# Patient Record
Sex: Male | Born: 1975 | Race: White | Hispanic: No | Marital: Single | Smoking: Current every day smoker
Health system: Southern US, Community
[De-identification: ages and names within clinical notes are randomized; demographics above are authoritative.]

## PROBLEM LIST (undated history)

## (undated) DIAGNOSIS — F32A Depression, unspecified: Secondary | ICD-10-CM

## (undated) DIAGNOSIS — F988 Other specified behavioral and emotional disorders with onset usually occurring in childhood and adolescence: Secondary | ICD-10-CM

## (undated) DIAGNOSIS — F419 Anxiety disorder, unspecified: Secondary | ICD-10-CM

## (undated) DIAGNOSIS — F329 Major depressive disorder, single episode, unspecified: Secondary | ICD-10-CM

## (undated) HISTORY — DX: Depression, unspecified: F32.A

## (undated) HISTORY — DX: Major depressive disorder, single episode, unspecified: F32.9

## (undated) HISTORY — DX: Anxiety disorder, unspecified: F41.9

## (undated) HISTORY — DX: Other specified behavioral and emotional disorders with onset usually occurring in childhood and adolescence: F98.8

---

## 2010-09-18 ENCOUNTER — Emergency Department (HOSPITAL_COMMUNITY)
Admission: EM | Admit: 2010-09-18 | Discharge: 2010-09-18 | Disposition: A | Discharge status: Home / Self Care | Payer: Self-pay | Attending: Emergency Medicine | Admitting: Emergency Medicine

## 2010-09-18 ENCOUNTER — Emergency Department (HOSPITAL_COMMUNITY): Payer: Self-pay

## 2010-09-18 DIAGNOSIS — IMO0002 Reserved for concepts with insufficient information to code with codable children: Secondary | ICD-10-CM | POA: Insufficient documentation

## 2010-09-18 DIAGNOSIS — M25519 Pain in unspecified shoulder: Secondary | ICD-10-CM | POA: Insufficient documentation

## 2010-09-20 ENCOUNTER — Emergency Department (HOSPITAL_COMMUNITY): Payer: Self-pay

## 2010-09-20 ENCOUNTER — Emergency Department (HOSPITAL_COMMUNITY)
Admission: EM | Admit: 2010-09-20 | Discharge: 2010-09-20 | Disposition: A | Discharge status: Home / Self Care | Payer: Self-pay | Attending: Emergency Medicine | Admitting: Emergency Medicine

## 2010-09-20 DIAGNOSIS — Y9383 Activity, rough housing and horseplay: Secondary | ICD-10-CM | POA: Insufficient documentation

## 2010-09-20 DIAGNOSIS — X58XXXA Exposure to other specified factors, initial encounter: Secondary | ICD-10-CM | POA: Insufficient documentation

## 2010-09-20 DIAGNOSIS — M25519 Pain in unspecified shoulder: Secondary | ICD-10-CM | POA: Insufficient documentation

## 2010-09-25 ENCOUNTER — Emergency Department (HOSPITAL_BASED_OUTPATIENT_CLINIC_OR_DEPARTMENT_OTHER): Payer: Self-pay

## 2010-09-25 ENCOUNTER — Emergency Department (HOSPITAL_BASED_OUTPATIENT_CLINIC_OR_DEPARTMENT_OTHER)
Admission: EM | Admit: 2010-09-25 | Discharge: 2010-09-25 | Disposition: A | Discharge status: Home / Self Care | Payer: Self-pay | Attending: Emergency Medicine | Admitting: Emergency Medicine

## 2010-09-25 DIAGNOSIS — F1910 Other psychoactive substance abuse, uncomplicated: Secondary | ICD-10-CM | POA: Insufficient documentation

## 2010-09-25 DIAGNOSIS — Z765 Malingerer [conscious simulation]: Secondary | ICD-10-CM | POA: Insufficient documentation

## 2010-09-25 MED ORDER — KETOROLAC TROMETHAMINE 30 MG/ML IJ SOLN
30.0000 mg | Freq: Once | INTRAMUSCULAR | Status: DC
  Filled 2010-09-25: qty 1

## 2010-09-25 NOTE — ED Provider Notes (Signed)
History     CSN: [id number] Arrival date & time: 09/25/2010 10:44 AM  No chief complaint on file.  HPI  35 year old male who comes in today stating that he had his right shoulder injured wrestling with his brother earlier this morning. He states his brother landed on his right shoulder with his elbow. He is complaining of pain in his right shoulder and states he felt something pop. He states he has not had any injury in the shoulder before. He denies any previous dislocation. He denies any numbness or tingling.  No past medical history on file. Reviewed No past surgical history on file.  No family history on file.  History  Substance Use Topics  . Smoking status: Not on file  . Smokeless tobacco: Not on file  . Alcohol Use: Not on file      Review of Systems  All other systems reviewed and are negative.    Physical Exam  There were no vitals taken for this visit.  Physical Exam  ED Course  Procedures  MDM Patient's previous charts reviewed and patient noted to have 2 previous visits to other emergency departments in her system this week with 2 right shoulder x-rays obtained. Patient again questioned about prior injury to her shoulder and denied that he has had a previous injury. Discussed with patient that this would be his third shoulder x-[employer]der this week. Patient then left AGAINST MEDICAL ADVICE.      Hilario Quarry, MD 09/28/10 1501

## 2010-09-27 ENCOUNTER — Emergency Department (HOSPITAL_COMMUNITY)
Admission: EM | Admit: 2010-09-27 | Discharge: 2010-09-27 | Disposition: A | Discharge status: Home / Self Care | Payer: Self-pay | Attending: Emergency Medicine | Admitting: Emergency Medicine

## 2010-09-27 ENCOUNTER — Emergency Department (HOSPITAL_COMMUNITY): Payer: Self-pay

## 2010-09-27 DIAGNOSIS — R569 Unspecified convulsions: Secondary | ICD-10-CM | POA: Insufficient documentation

## 2010-09-27 DIAGNOSIS — R1110 Vomiting, unspecified: Secondary | ICD-10-CM | POA: Insufficient documentation

## 2010-09-27 DIAGNOSIS — J329 Chronic sinusitis, unspecified: Secondary | ICD-10-CM | POA: Insufficient documentation

## 2010-09-27 LAB — URINALYSIS, ROUTINE W REFLEX MICROSCOPIC
Bilirubin Urine: NEGATIVE
Glucose, UA: NEGATIVE mg/dL
Glucose, UA: NEGATIVE mg/dL
Hgb urine dipstick: NEGATIVE
Hgb urine dipstick: NEGATIVE
Leukocytes, UA: NEGATIVE
Protein, ur: NEGATIVE mg/dL
Specific Gravity, Urine: 1.022 (ref 1.005–1.030)
Specific Gravity, Urine: 1.008 (ref 1.005–1.030)
Urobilinogen, UA: 0.2 mg/dL (ref 0.0–1.0)
pH: 5.5 (ref 5.0–8.0)
pH: 7 (ref 5.0–8.0)

## 2010-09-27 LAB — POCT I-STAT, CHEM 8
Chloride: 102 mEq/L (ref 96–112)
Creatinine, Ser: 1.2 mg/dL (ref 0.50–1.35)
Glucose, Bld: 112 mg/dL — ABNORMAL HIGH (ref 70–99)
HCT: 42 % (ref 39.0–52.0)
Hemoglobin: 14.3 g/dL (ref 13.0–17.0)
Potassium: 3.8 mEq/L (ref 3.5–5.1)
Sodium: 140 mEq/L (ref 135–145)

## 2010-09-27 LAB — CBC
HCT: 39.2 % (ref 39.0–52.0)
Hemoglobin: 13.6 g/dL (ref 13.0–17.0)
MCV: 92.9 fL (ref 78.0–100.0)
WBC: 7.2 10*3/uL (ref 4.0–10.5)

## 2010-09-27 LAB — DIFFERENTIAL
Basophils Absolute: 0 10*3/uL (ref 0.0–0.1)
Eosinophils Relative: 4 % (ref 0–5)
Lymphocytes Relative: 26 % (ref 12–46)
Lymphs Abs: 1.9 10*3/uL (ref 0.7–4.0)
Neutro Abs: 4.5 10*3/uL (ref 1.7–7.7)

## 2010-09-27 LAB — RAPID URINE DRUG SCREEN, HOSP PERFORMED
Amphetamines: NOT DETECTED
Cocaine: NOT DETECTED
Opiates: NOT DETECTED

## 2010-10-22 ENCOUNTER — Emergency Department: Payer: Self-pay | Admitting: Unknown Physician Specialty

## 2010-11-05 ENCOUNTER — Emergency Department (HOSPITAL_COMMUNITY)
Admission: EM | Admit: 2010-11-05 | Discharge: 2010-11-05 | Disposition: A | Discharge status: Home / Self Care | Payer: Self-pay | Attending: Emergency Medicine | Admitting: Emergency Medicine

## 2010-11-05 DIAGNOSIS — K089 Disorder of teeth and supporting structures, unspecified: Secondary | ICD-10-CM | POA: Insufficient documentation

## 2011-01-08 ENCOUNTER — Emergency Department (HOSPITAL_BASED_OUTPATIENT_CLINIC_OR_DEPARTMENT_OTHER)
Admission: EM | Admit: 2011-01-08 | Discharge: 2011-01-08 | Disposition: A | Discharge status: Home / Self Care | Payer: Self-pay

## 2012-02-22 ENCOUNTER — Emergency Department (HOSPITAL_COMMUNITY)
Admission: EM | Admit: 2012-02-22 | Discharge: 2012-02-22 | Disposition: A | Discharge status: Home / Self Care | Payer: Self-pay | Attending: Emergency Medicine | Admitting: Emergency Medicine

## 2012-02-22 DIAGNOSIS — R112 Nausea with vomiting, unspecified: Secondary | ICD-10-CM | POA: Insufficient documentation

## 2012-02-22 DIAGNOSIS — H53149 Visual discomfort, unspecified: Secondary | ICD-10-CM | POA: Insufficient documentation

## 2012-02-22 DIAGNOSIS — Z789 Other specified health status: Secondary | ICD-10-CM

## 2012-02-22 DIAGNOSIS — F1010 Alcohol abuse, uncomplicated: Secondary | ICD-10-CM | POA: Insufficient documentation

## 2012-02-22 DIAGNOSIS — F10129 Alcohol abuse with intoxication, unspecified: Secondary | ICD-10-CM

## 2012-02-22 LAB — COMPREHENSIVE METABOLIC PANEL WITH GFR
AST: 46 U/L — ABNORMAL HIGH (ref 0–37)
Albumin: 4.6 g/dL (ref 3.5–5.2)
BUN: 13 mg/dL (ref 6–23)
CO2: 21 meq/L (ref 19–32)
Calcium: 9.5 mg/dL (ref 8.4–10.5)
Chloride: 102 meq/L (ref 96–112)
Creatinine, Ser: 0.73 mg/dL (ref 0.50–1.35)
GFR calc Af Amer: >90 mL/min
GFR calc non Af Amer: >90 mL/min
Glucose, Bld: 99 mg/dL (ref 70–99)
Sodium: 139 meq/L (ref 135–145)
Total Bilirubin: 0.5 mg/dL (ref 0.3–1.2)
Total Protein: 7.9 g/dL (ref 6.0–8.3)

## 2012-02-22 LAB — CBC WITH DIFFERENTIAL/PLATELET
Basophils Relative: 0 % (ref 0–1)
Eosinophils Absolute: 0.1 10*3/uL (ref 0.0–0.7)
Eosinophils Relative: 1 % (ref 0–5)
Lymphs Abs: 1.7 10*3/uL (ref 0.7–4.0)
MCH: 32.8 pg (ref 26.0–34.0)
MCHC: 35.3 g/dL (ref 30.0–36.0)
MCV: 92.9 fL (ref 78.0–100.0)
Monocytes Relative: 5 % (ref 3–12)
Platelets: 297 10*3/uL (ref 150–400)
RBC: 5.18 MIL/uL (ref 4.22–5.81)

## 2012-02-22 MED ORDER — SODIUM CHLORIDE 0.9 % IV BOLUS (SEPSIS)
2000.0000 mL | Freq: Once | INTRAVENOUS | Status: AC
  Administered 2012-02-22: 1000 mL via INTRAVENOUS

## 2012-02-22 MED ORDER — PROMETHAZINE HCL 25 MG PO TABS: 25.0000 mg | ORAL_TABLET | Freq: Four times a day (QID) | ORAL | Status: DC | PRN

## 2012-02-22 MED ORDER — PROMETHAZINE HCL 25 MG/ML IJ SOLN
12.5000 mg | Freq: Once | INTRAMUSCULAR | Status: DC
Start: 2012-02-22 — End: 2012-02-22
  Filled 2012-02-22: qty 1

## 2012-02-22 MED ORDER — ONDANSETRON HCL 4 MG/2ML IJ SOLN: 4.0000 mg | Freq: Once | INTRAMUSCULAR | Status: DC

## 2012-02-22 MED ORDER — ONDANSETRON 4 MG PO TBDP
8.0000 mg | ORAL_TABLET | Freq: Once | ORAL | Status: AC
  Administered 2012-02-22: 8 mg via ORAL
  Filled 2012-02-22 (×2): qty 2

## 2012-02-22 NOTE — ED Provider Notes (Signed)
Medical screening examination/treatment/procedure(s) were performed by non-physician practitioner and as supervising physician I was immediately available for consultation/collaboration.  Rilyn Scroggs Lytle Michaels, MD 02/22/12 2300

## 2012-02-22 NOTE — ED Provider Notes (Signed)
History     CSN: [id number]  Arrival date & time 02/22/12  0532   First MD Initiated Contact with Patient 02/22/12 [phone]      Chief Complaint  Patient presents with  . Emesis  . Headache    (Consider location/radiation/quality/duration/timing/severity/associated sxs/prior treatment) HPI Patient presents to the emergency department complaining of severe headache and emesis since around 10pm last night. He usually doesn't drink, but last night he went out with some friends and drank too much liquor. He denies ingesting other substances. He had his last drink around 8pm last night and has since been unable to hold anything down. He has not tried to take any medication to relieve his pain. He reports photophobia, phonophobia, nausea and vomiting, which is non-bloody. Denies loss of consciousness, abdominal pain, chest pain, shortness of breath, dizziness, syncope, trauma, incontinence, visual impairment, motor or sensory deficits.   No past medical history on file.  No past surgical history on file.  No family history on file.  History  Substance Use Topics  . Smoking status: Not on file  . Smokeless tobacco: Not on file  . Alcohol Use: Not on file      Review of Systems All other systems negative except as documented in the HPI. All pertinent positives and negatives as reviewed in the HPI.  Allergies  Review of patient's allergies indicates no known allergies.  Home Medications  No current outpatient prescriptions on file.  BP 116/78  Pulse 79  Temp 98.2 F (36.8 C) (Oral)  Resp 18  SpO2 97%  Physical Exam  Nursing note and vitals reviewed. Constitutional: He is oriented to person, place, and time. Vital signs are normal. He appears well-developed and well-nourished. He appears distressed.       Acutely intoxicated.  HENT:  Head: Normocephalic and atraumatic.  Mouth/Throat: Mucous membranes are dry.  Eyes: Pupils are equal, round, and reactive to light.  Neck:  Normal range of motion. Neck supple.  Cardiovascular: Normal rate, regular rhythm and normal heart sounds.  Exam reveals no gallop and no friction rub.   No murmur heard. Pulmonary/Chest: Effort normal and breath sounds normal. No respiratory distress.  Abdominal: Soft. He exhibits no distension.  Neurological: He is alert and oriented to person, place, and time. No cranial nerve deficit. He exhibits normal muscle tone.  Skin: Skin is warm and dry.    ED Course  Procedures (including critical care time)   Labs Reviewed  CBC WITH DIFFERENTIAL  COMPREHENSIVE METABOLIC PANEL   5:62 AM recheck.  The patient is feeling completely better following IV fluids.  Mucous membranes are moist at this time  Patient is advised to return here as needed for any worsening in his symptoms at presentation to increase his fluid intake.  Told to followup with his primary doctor as needed.   MDM  MDM Reviewed: nursing note and vitals Interpretation: labs            Carlyle Dolly, PA-C 02/22/12 0920

## 2012-02-22 NOTE — ED Notes (Signed)
Pt arrived via private vehicle c/o N/V, and HA. Pt states he drank a pint of brandy, and has not been able to keep anything down since.

## 2012-02-22 NOTE — ED Notes (Signed)
Pt refused zofran, became belligerent that I did not have a room available right then. Pt asked his mother to get the car, that he wanted to leave. I informed him that rooms were coming available, but he still wanted to leave

## 2012-02-22 NOTE — ED Notes (Signed)
Pt denies n/v or pain at this time

## 2012-02-22 NOTE — ED Notes (Addendum)
Pt presents to ED for evaluation of headache and vomiting that began yesterday, pt uncooperative upon RN interview.  States "I think I have alcohol poisoning".

## 2012-03-22 ENCOUNTER — Emergency Department (HOSPITAL_COMMUNITY): Payer: Self-pay

## 2012-03-22 ENCOUNTER — Encounter (HOSPITAL_COMMUNITY): Payer: Self-pay | Admitting: Emergency Medicine

## 2012-03-22 ENCOUNTER — Emergency Department (HOSPITAL_COMMUNITY)
Admission: EM | Admit: 2012-03-22 | Discharge: 2012-03-22 | Disposition: A | Discharge status: Home / Self Care | Payer: Self-pay | Attending: Emergency Medicine | Admitting: Emergency Medicine

## 2012-03-22 DIAGNOSIS — R197 Diarrhea, unspecified: Secondary | ICD-10-CM | POA: Insufficient documentation

## 2012-03-22 DIAGNOSIS — K529 Noninfective gastroenteritis and colitis, unspecified: Secondary | ICD-10-CM

## 2012-03-22 DIAGNOSIS — K5289 Other specified noninfective gastroenteritis and colitis: Secondary | ICD-10-CM | POA: Insufficient documentation

## 2012-03-22 DIAGNOSIS — F17200 Nicotine dependence, unspecified, uncomplicated: Secondary | ICD-10-CM | POA: Insufficient documentation

## 2012-03-22 DIAGNOSIS — R109 Unspecified abdominal pain: Secondary | ICD-10-CM | POA: Insufficient documentation

## 2012-03-22 LAB — COMPREHENSIVE METABOLIC PANEL
AST: 16 U/L (ref 0–37)
BUN: 12 mg/dL (ref 6–23)
CO2: 24 mEq/L (ref 19–32)
Chloride: 100 mEq/L (ref 96–112)
Creatinine, Ser: 0.85 mg/dL (ref 0.50–1.35)
GFR calc non Af Amer: >90 mL/min (ref >90)
Glucose, Bld: 102 mg/dL — ABNORMAL HIGH (ref 70–99)
Total Bilirubin: 0.4 mg/dL (ref 0.3–1.2)

## 2012-03-22 LAB — CBC WITH DIFFERENTIAL/PLATELET
Basophils Relative: 0 % (ref 0–1)
Eosinophils Relative: 0 % (ref 0–5)
HCT: 47 % (ref 39.0–52.0)
Hemoglobin: 16.5 g/dL (ref 13.0–17.0)
Lymphocytes Relative: 13 % (ref 12–46)
Monocytes Relative: 5 % (ref 3–12)
Neutro Abs: 12.4 10*3/uL — ABNORMAL HIGH (ref 1.7–7.7)
WBC: 15.2 10*3/uL — ABNORMAL HIGH (ref 4.0–10.5)

## 2012-03-22 MED ORDER — PROMETHAZINE HCL 25 MG PO TABS: 25.0000 mg | ORAL_TABLET | Freq: Four times a day (QID) | ORAL | Status: DC | PRN

## 2012-03-22 MED ORDER — CIPROFLOXACIN HCL 500 MG PO TABS: 500.0000 mg | ORAL_TABLET | Freq: Two times a day (BID) | ORAL | Status: DC

## 2012-03-22 MED ORDER — IOHEXOL 300 MG/ML  SOLN
100.0000 mL | Freq: Once | INTRAMUSCULAR | Status: AC | PRN
  Administered 2012-03-22: 100 mL via INTRAVENOUS

## 2012-03-22 MED ORDER — SODIUM CHLORIDE 0.9 % IV BOLUS (SEPSIS)
500.0000 mL | Freq: Once | INTRAVENOUS | Status: AC
  Administered 2012-03-22: 12:00:00 via INTRAVENOUS

## 2012-03-22 MED ORDER — OXYCODONE-ACETAMINOPHEN 5-325 MG PO TABS: 1.0000 | ORAL_TABLET | Freq: Four times a day (QID) | ORAL | Status: DC | PRN

## 2012-03-22 MED ORDER — MORPHINE SULFATE 4 MG/ML IJ SOLN
4.0000 mg | Freq: Once | INTRAMUSCULAR | Status: AC
  Administered 2012-03-22: 4 mg via INTRAVENOUS
  Filled 2012-03-22: qty 1

## 2012-03-22 NOTE — ED Provider Notes (Signed)
History     CSN: [id number]  Arrival date & time 03/22/12  4098   First MD Initiated Contact with Patient 03/22/12 1151      Chief Complaint  Patient presents with  . Rectal Bleeding    (Consider location/radiation/quality/duration/timing/severity/associated sxs/prior treatment) Patient is a 37 y.o. male presenting with hematochezia. The history is provided by the patient.  Rectal Bleeding  The current episode started yesterday. Associated symptoms include abdominal pain and diarrhea. Pertinent negatives include no nausea, no vomiting, no chest pain, no headaches and no rash.  patient began with severe crampy abdominal last night around 8 PM. Since then he has had some diarrhea and some blood in the stool. He states his been red blood. He states there's been some clots also. The cramping is coming on. No other bleeding. No nausea vomiting. No history of abdominal problems. No fevers. No sick contacts. No lightheadedness or dizziness.he has a recent hand fracture and has been on narcotic pain medicines and occasional NSAIDs  History reviewed. No pertinent past medical history.  History reviewed. No pertinent past surgical history.  No family history on file.  History  Substance Use Topics  . Smoking status: Current Every Day Smoker  . Smokeless tobacco: Not on file  . Alcohol Use: No      Review of Systems  Constitutional: Negative for activity change and appetite change.  HENT: Negative for neck stiffness.   Eyes: Negative for pain.  Respiratory: Negative for chest tightness and shortness of breath.   Cardiovascular: Negative for chest pain and leg swelling.  Gastrointestinal: Positive for abdominal pain, diarrhea, blood in stool and hematochezia. Negative for nausea and vomiting.  Genitourinary: Negative for flank pain.  Musculoskeletal: Negative for back pain.  Skin: Negative for rash.  Neurological: Negative for weakness, numbness and headaches.   Psychiatric/Behavioral: Negative for behavioral problems.    Allergies  Review of patient's allergies indicates no known allergies.  Home Medications   Current Outpatient Rx  Name  Route  Sig  Dispense  Refill  . ibuprofen (ADVIL,MOTRIN) 200 MG tablet   Oral   Take 400 mg by mouth every 8 (eight) hours as needed for pain.         Marland Kitchen oxyCODONE-acetaminophen (PERCOCET) 10-325 MG per tablet   Oral   Take 1 tablet by mouth every 6 (six) hours as needed for pain.         Marland Kitchen penicillin v potassium (VEETID) 500 MG tablet   Oral   Take 500 mg by mouth 3 (three) times daily.         . ciprofloxacin (CIPRO) 500 MG tablet   Oral   Take 1 tablet (500 mg total) by mouth every 12 (twelve) hours.   10 tablet   0   . oxyCODONE-acetaminophen (PERCOCET/ROXICET) 5-325 MG per tablet   Oral   Take 1-2 tablets by mouth every 6 (six) hours as needed for pain.   10 tablet   0   . promethazine (PHENERGAN) 25 MG tablet   Oral   Take 1 tablet (25 mg total) by mouth every 6 (six) hours as needed for nausea.   15 tablet   0     BP 114/82  Pulse 64  Temp(Src) 98.3 F (36.8 C) (Oral)  SpO2 100%  Physical Exam  Nursing note and vitals reviewed. Constitutional: He is oriented to person, place, and time. He appears well-developed and well-nourished.  HENT:  Head: Normocephalic and atraumatic.  Eyes: EOM are normal. Pupils  are equal, round, and reactive to light.  Neck: Normal range of motion. Neck supple.  Cardiovascular: Normal rate, regular rhythm and normal heart sounds.   No murmur heard. Pulmonary/Chest: Effort normal and breath sounds normal.  Abdominal: Soft. Bowel sounds are normal. He exhibits no distension and no mass. There is no tenderness. There is no rebound and no guarding.  Musculoskeletal: Normal range of motion. He exhibits no edema.  Neurological: He is alert and oriented to person, place, and time. No cranial nerve deficit.  Skin: Skin is warm and dry.   Psychiatric: He has a normal mood and affect.    ED Course  Procedures (including critical care time)  Labs Reviewed  CBC WITH DIFFERENTIAL - Abnormal; Notable for the following:    WBC 15.2 (*)    Neutrophils Relative 82 (*)    Neutro Abs 12.4 (*)    All other components within normal limits  COMPREHENSIVE METABOLIC PANEL - Abnormal; Notable for the following:    Potassium 3.3 (*)    Glucose, Bld 102 (*)    All other components within normal limits   Ct Abdomen Pelvis W Contrast  03/22/2012  *RADIOLOGY REPORT*  Clinical Data: 37 year old male with abdominal and pelvic pain with rectal bleeding.  CT ABDOMEN AND PELVIS WITH CONTRAST  Technique:  Multidetector CT imaging of the abdomen and pelvis was performed following the standard protocol during bolus administration of intravenous contrast.  Contrast: OMNIPAQUE IOHEXOL 300 MG/ML  SOLN  Comparison: 03/22/2012 radiographs  Findings: Wall thickening of the distal transverse and descending colon identified with adjacent inflammation - compatible with colitis. There is no evidence of bowel obstruction, pneumoperitoneum or abscess. A trace amount of free pelvic fluid is noted. No small bowel abnormalities are identified.  The liver, spleen, adrenal glands, kidneys, gallbladder and pancreas are unremarkable.  There is no evidence of enlarged lymph nodes, biliary dilatation or abdominal aortic aneurysm. The visualized mesenteric vasculature is patent.  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 A probable intramuscular ganglion cyst within the lower right iliacus muscle identified.  IMPRESSION: Left-sided colitis - likely inflammatory or infectious.  Ischemia would be considered much less likely. Small amount of free pelvic fluid.  No evidence of bowel obstruction, abscess or pneumoperitoneum.   Original Report Authenticated By: Harmon Pier, M.D.    Dg Abd 2 Views  03/22/2012  *RADIOLOGY REPORT*   Clinical Data: Right lower quadrant abdominal pain for 2 days, nausea and vomiting  ABDOMEN - 2 VIEW  Comparison: None.  Findings: Supine and erect views of the abdomen show no bowel obstruction.  No free air is seen.  No opaque calculi are noted.  IMPRESSION: No bowel obstruction.  No free air.   Original Report Authenticated By: Dwyane Dee, M.D.      1. Colitis       MDM  Patient presents with right-sided abdominal pain and rectal bleeding. White count is elevated. CT scan was done and shows colitis. Patient be treated with pain medicines, antiemetics, and antibiotics and will followup with GI.        Juliet Rude. Rubin Payor, MD 03/22/12 1601

## 2012-03-22 NOTE — ED Notes (Signed)
Per patient, was having right abdominal pain and hard large amount of blood with BM-has been on pain meds for right wrist injury which happened on Tues

## 2012-03-22 NOTE — ED Notes (Signed)
Attempted rounding - pt not in room

## 2012-08-14 ENCOUNTER — Encounter (HOSPITAL_COMMUNITY): Payer: Self-pay | Admitting: Emergency Medicine

## 2012-08-14 ENCOUNTER — Emergency Department (HOSPITAL_COMMUNITY)
Admission: EM | Admit: 2012-08-14 | Discharge: 2012-08-14 | Disposition: A | Discharge status: Home / Self Care | Payer: BC Managed Care – PPO | Attending: Emergency Medicine | Admitting: Emergency Medicine

## 2012-08-14 ENCOUNTER — Emergency Department (HOSPITAL_COMMUNITY): Payer: BC Managed Care – PPO

## 2012-08-14 DIAGNOSIS — S6990XA Unspecified injury of unspecified wrist, hand and finger(s), initial encounter: Secondary | ICD-10-CM | POA: Insufficient documentation

## 2012-08-14 DIAGNOSIS — X500XXA Overexertion from strenuous movement or load, initial encounter: Secondary | ICD-10-CM | POA: Insufficient documentation

## 2012-08-14 DIAGNOSIS — S59909A Unspecified injury of unspecified elbow, initial encounter: Secondary | ICD-10-CM | POA: Insufficient documentation

## 2012-08-14 DIAGNOSIS — F17200 Nicotine dependence, unspecified, uncomplicated: Secondary | ICD-10-CM | POA: Insufficient documentation

## 2012-08-14 DIAGNOSIS — Y9389 Activity, other specified: Secondary | ICD-10-CM | POA: Insufficient documentation

## 2012-08-14 DIAGNOSIS — Y929 Unspecified place or not applicable: Secondary | ICD-10-CM | POA: Insufficient documentation

## 2012-08-14 DIAGNOSIS — M25531 Pain in right wrist: Secondary | ICD-10-CM

## 2012-08-14 MED ORDER — HYDROCODONE-ACETAMINOPHEN 5-325 MG PO TABS
ORAL_TABLET | ORAL | Status: AC
Start: 2012-08-14 — End: ?

## 2012-08-14 MED ORDER — NAPROXEN 500 MG PO TABS: 500.0000 mg | ORAL_TABLET | Freq: Two times a day (BID) | ORAL | Status: AC

## 2012-08-14 NOTE — ED Provider Notes (Signed)
History    This chart was scribed for non-physician practitioner, Renne Crigler, PA-C, working with Sunnie Nielsen, MD by Donne Anon, ED Scribe. This patient was seen in room WTR8/WTR8 and the patient's care was started at 1818.  CSN: [id number] Arrival date & time 08/14/12  1750  First MD Initiated Contact with Patient 08/14/12 1818     Chief Complaint  Patient presents with  . Wrist Pain    left    The history is provided by the patient. No language interpreter was used.   HPI Comments: Tony Stanley is a 37 y.o. male who presents to the Emergency Department complaining of 3 weeks of sudden onset, gradually worsening, constant left wrist pain described as aching and throbbing that began when he injured his wrist by flexing it backwards while at work. He states he was seen by an urgent care center at that time and was told he may have sprained his wrist and was discharged with medication. He reinjured the wrist last night when he was moving a TV.  He states he has limited ROM of his wrist and has difficulty steering his car. He has tried Naproxen and ibuprofen with mild relief. He denies numbness or tingling in his hands or any other pain.            History reviewed. No pertinent past medical history. History reviewed. No pertinent past surgical history. No family history on file. History  Substance Use Topics  . Smoking status: Current Every Day Smoker  . Smokeless tobacco: Not on file  . Alcohol Use: No    Review of Systems  Constitutional: Negative for activity change.  HENT: Negative for neck pain.   Musculoskeletal: Positive for arthralgias. Negative for back pain, joint swelling and gait problem.  Skin: Negative for wound.  Neurological: Negative for weakness and numbness.    Allergies  Review of patient's allergies indicates no known allergies.  Home Medications   Current Outpatient Rx  Name  Route  Sig  Dispense  Refill  . ibuprofen (ADVIL,MOTRIN) 200 MG  tablet   Oral   Take 400 mg by mouth every 8 (eight) hours as needed for pain.         . naproxen (NAPROSYN) 250 MG tablet   Oral   Take 250 mg by mouth 2 (two) times daily as needed (pain).          BP 124/67  Pulse 76  Temp(Src) 98.5 F (36.9 C) (Oral)  Resp 18  SpO2 98%  Physical Exam  Nursing note and vitals reviewed. Constitutional: He appears well-developed and well-nourished. No distress.  HENT:  Head: Normocephalic and atraumatic.  Eyes: Conjunctivae are normal.  Neck: Normal range of motion. Neck supple. No tracheal deviation present.  Cardiovascular: Normal rate and normal pulses.   Pulmonary/Chest: Effort normal. No respiratory distress.  Musculoskeletal: Normal range of motion. He exhibits tenderness. He exhibits no edema.  Tenderness over navicular left wrist with mildly decreased extension and flexion of the wrist. Normal pulse. Normal sensation. Normal cap refill.   Neurological: He is alert. No sensory deficit.  Motor, sensation, and vascular distal to the injury is fully intact.   Skin: Skin is warm and dry.  Psychiatric: He has a normal mood and affect. His behavior is normal.    ED Course  Procedures (including critical care time) DIAGNOSTIC STUDIES: Oxygen Saturation is 98% on RA, normal by my interpretation.    COORDINATION OF CARE: 6:24 PM Discussed treatment plan which  includes left wrist xray and medication with pt at bedside and pt agreed to plan. Advised pt to follow up with hand specialist.   7:20 PM Rechecked pt. Discussed negative xray. Advised pt to follow up with Dr. Charlesetta Shanks.    Labs Reviewed - No data to display Dg Wrist Complete Left  08/14/2012   *RADIOLOGY REPORT*  Clinical Data: The posterior wrist pain.  LEFT WRIST - COMPLETE 3+ VIEW  Comparison: 07/29/2012  Findings: No acute bony abnormality.  Specifically, no fracture, subluxation, or dislocation.  Soft tissues are intact. Joint spaces are maintained.  Normal bone  mineralization.  IMPRESSION: Negative.   Original Report Authenticated By: Charlett Nose, M.D.   1. Wrist pain, right    Patient seen and examined. X-ray performed to r/o occult navicular fracture given anatomic snuffbox tenderness. X-ray reviewed and is negative.   Vital signs reviewed and are as follows: BP 122/76  Pulse 76  Temp(Src) 98.5 F (36.9 C) (Oral)  Resp 20  SpO2 99%  Patient counseled on use of narcotic pain medications. Counseled not to combine these medications with others containing tylenol. Urged not to drink alcohol, drive, or perform any other activities that requires focus while taking these medications. The patient verbalizes understanding and agrees with the plan.    MDM  Wrist pain, likely ligamentous injury. Continue RICE, ortho f/u if not improved. Pt has wrist splint. Arm and wrist is neurovascularly intact.    I personally performed the services described in this documentation, which was scribed in my presence. The recorded information has been reviewed and is accurate.    Renne Crigler, PA-C 08/15/12 1610

## 2012-08-14 NOTE — ED Notes (Signed)
Pt sprained wrist about 3 weeks ago. Was working today and think his wrist went back a little further than normal. Hurts with movement, tender to touch, and states he cannot make a fist.

## 2012-08-15 NOTE — ED Provider Notes (Signed)
Medical screening examination/treatment/procedure(s) were performed by non-physician practitioner and as supervising physician I was immediately available for consultation/collaboration.  Lamark Schue, MD 08/15/12 1720 

## 2012-12-25 IMAGING — CR DG SHOULDER 3+V*R*
1 series · 3 of 3 positions shown · non-contrast
Comparison: none

REASON FOR EXAM: injury
COMMENTS:

PROCEDURE:     DXR - DXR SHOULDER RIGHT COMPLETE  - October 22, 2010 [DATE]
RESULT:     Acromioclavicular degenerative change noted. A fracture(
possibly old) of the distal clavicle is noted. This may be a fractured
osteophyte. No prior exams available for comparison. No evidence of
dislocation.

[Series 1: view not recorded · 0.17mm/px · 3 of 3 slices shown]
[im 1/3]
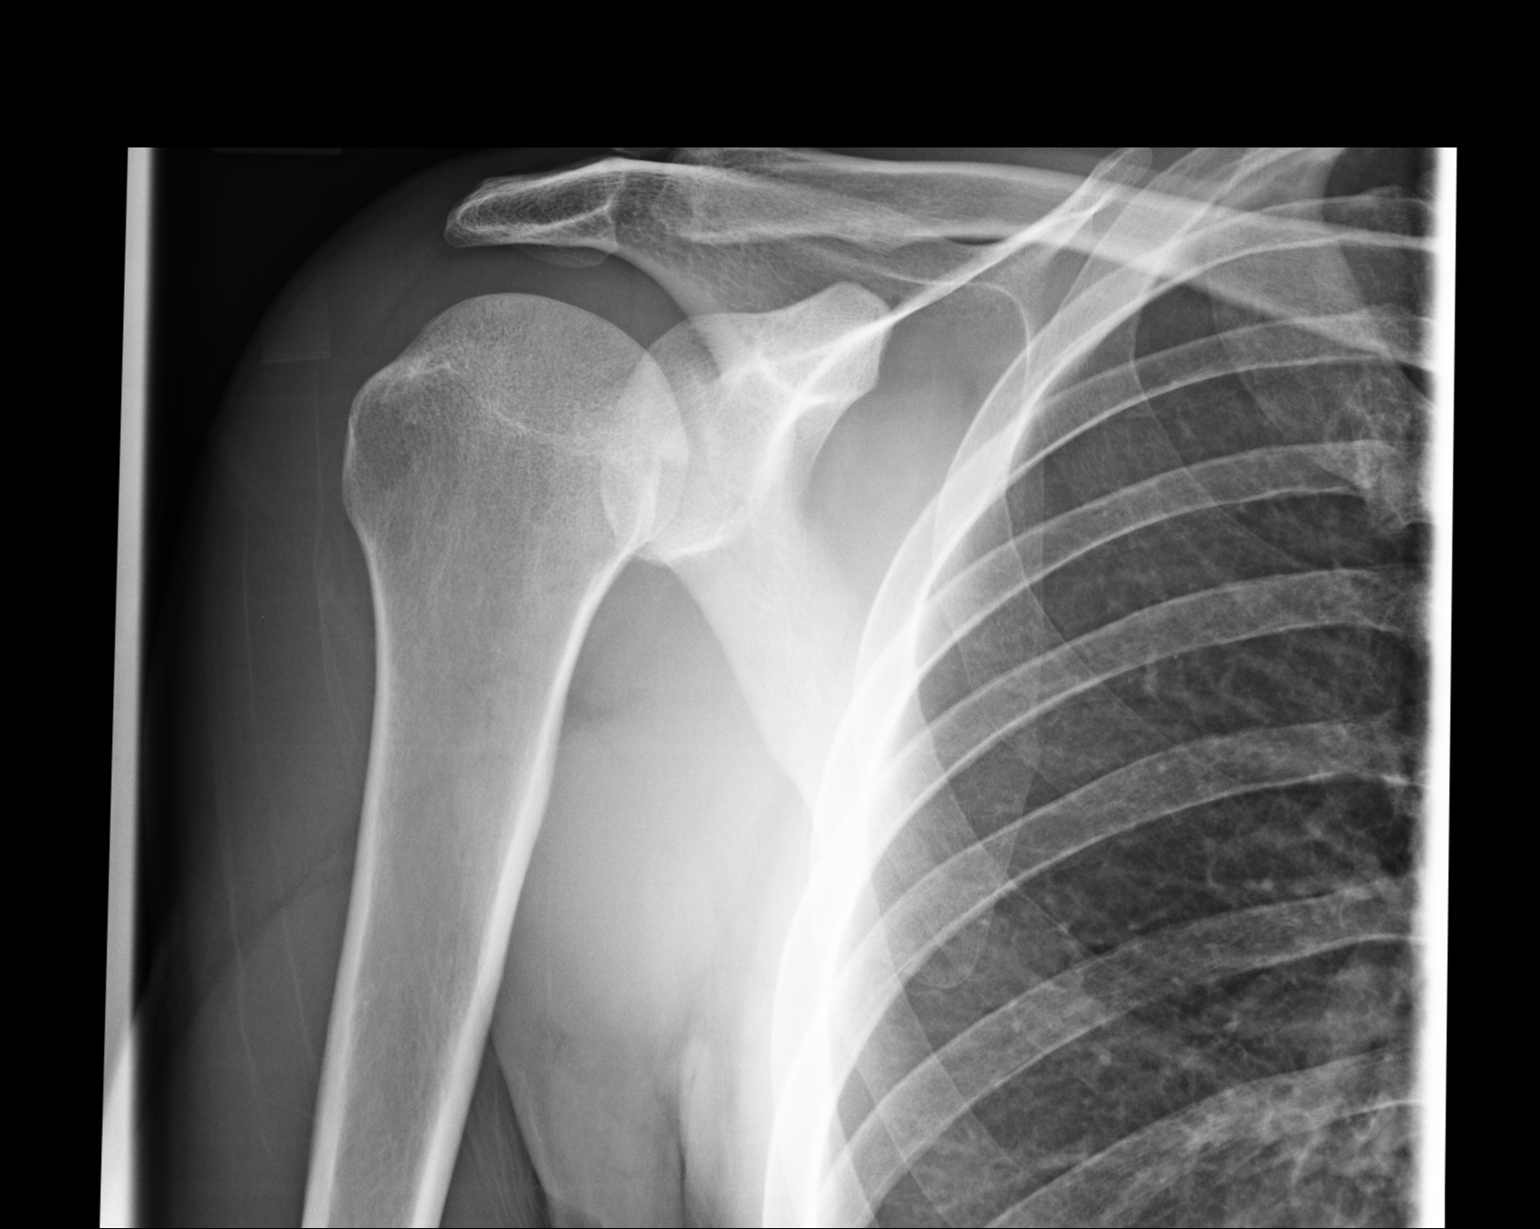
[im 2/3]
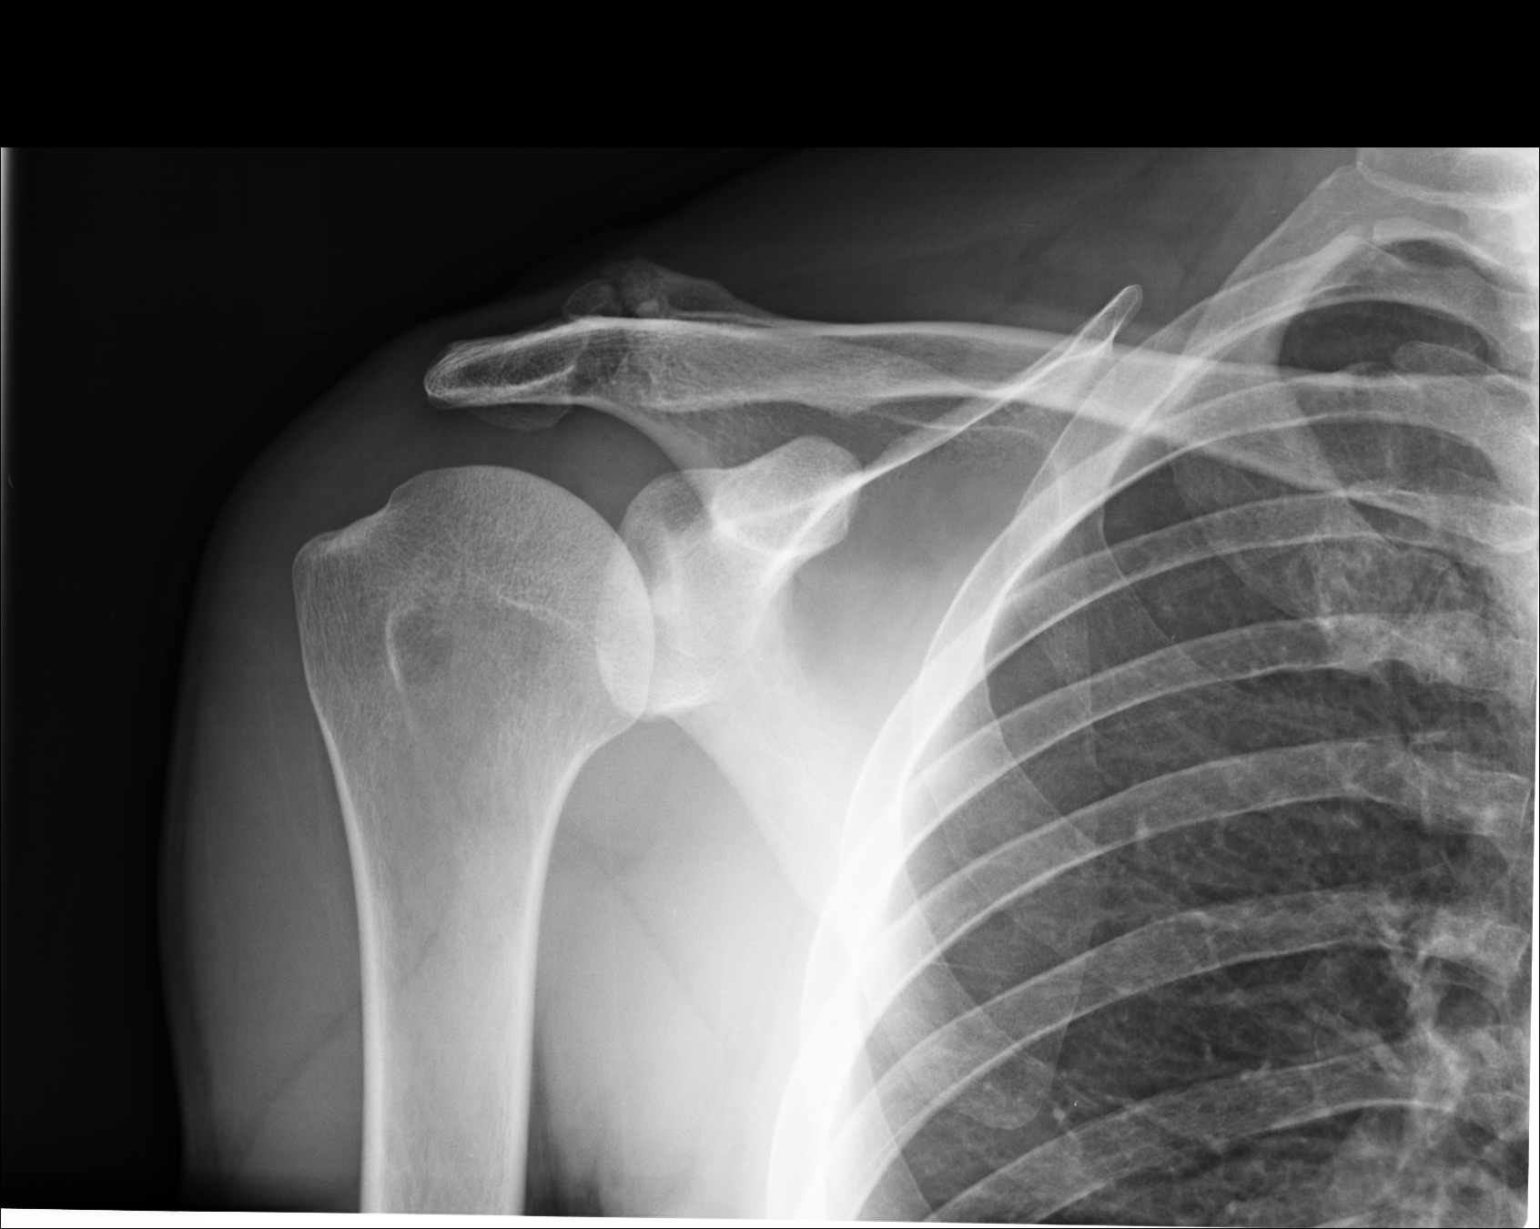
[im 3/3]
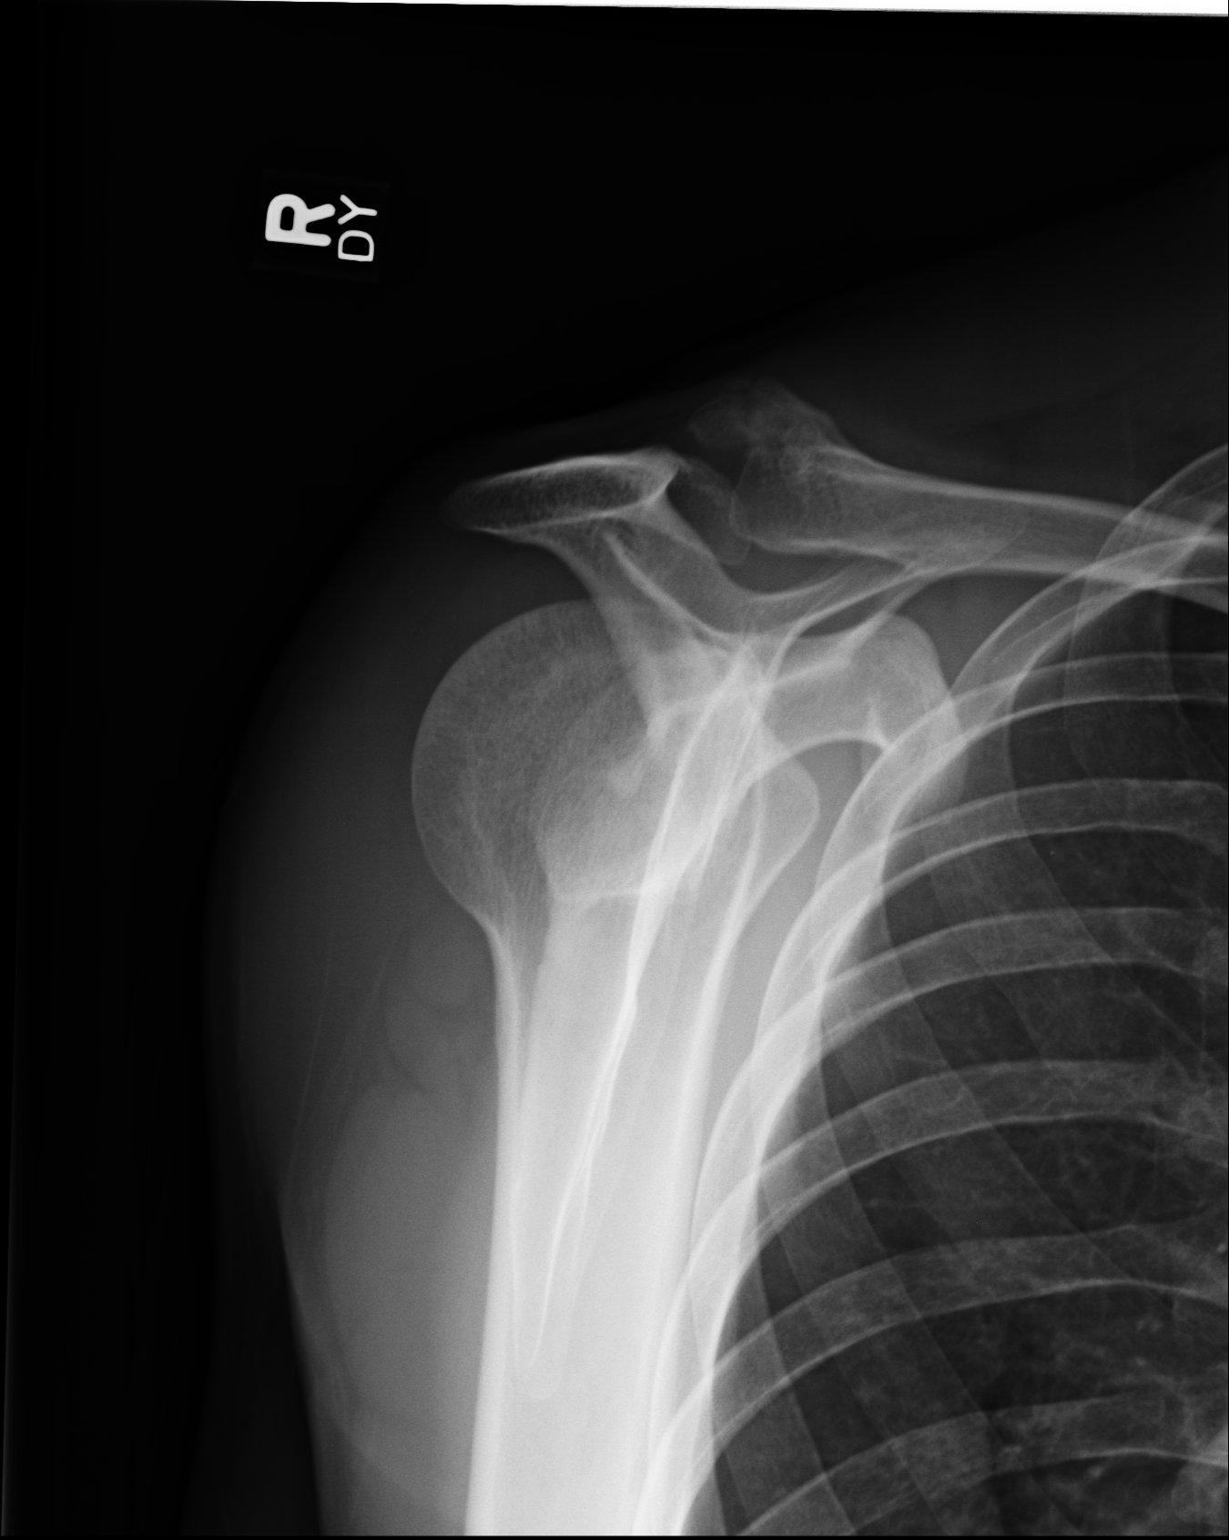

[3 of 3 positions shown; findings below may reference images not displayed]

IMPRESSION: Abnormal distal clavicle as described above. Fractured
clavicular osteophyte or old clavicular fracture could present in this
fashion.

## 2013-03-24 ENCOUNTER — Ambulatory Visit: Payer: Self-pay | Admitting: Internal Medicine

## 2013-03-24 VITALS — BP 130/86 | HR 94 | Temp 98.1°F | Resp 16 | Ht 73.0 in | Wt 163.0 lb

## 2013-03-24 DIAGNOSIS — Z79899 Other long term (current) drug therapy: Secondary | ICD-10-CM

## 2013-03-24 DIAGNOSIS — F909 Attention-deficit hyperactivity disorder, unspecified type: Secondary | ICD-10-CM

## 2013-03-24 DIAGNOSIS — F32A Depression, unspecified: Secondary | ICD-10-CM | POA: Insufficient documentation

## 2013-03-24 DIAGNOSIS — F3289 Other specified depressive episodes: Secondary | ICD-10-CM

## 2013-03-24 DIAGNOSIS — F410 Panic disorder [episodic paroxysmal anxiety] without agoraphobia: Secondary | ICD-10-CM

## 2013-03-24 DIAGNOSIS — F329 Major depressive disorder, single episode, unspecified: Secondary | ICD-10-CM

## 2013-03-24 DIAGNOSIS — F17200 Nicotine dependence, unspecified, uncomplicated: Secondary | ICD-10-CM

## 2013-03-24 MED ORDER — SERTRALINE HCL 100 MG PO TABS: 100.0000 mg | ORAL_TABLET | Freq: Every day | ORAL | Status: AC

## 2013-03-24 MED ORDER — ALPRAZOLAM 1 MG PO TABS: 1.0000 mg | ORAL_TABLET | Freq: Two times a day (BID) | ORAL | Status: AC

## 2013-03-24 NOTE — Patient Instructions (Signed)
Depression, Adult Depression refers to feeling sad, low, down in the dumps, blue, gloomy, or empty. In general, there are two kinds of depression: 1. Depression that we all experience from time to time because of upsetting life experiences, including the loss of a job or the ending of a relationship (normal sadness or normal grief). This kind of depression is considered normal, is short lived, and resolves within a few days to 2 weeks. (Depression experienced after the loss of a loved one is called bereavement. Bereavement often lasts longer than 2 weeks but normally gets better with time.) 2. Clinical depression, which lasts longer than normal sadness or normal grief or interferes with your ability to function at home, at work, and in school. It also interferes with your personal relationships. It affects almost every aspect of your life. Clinical depression is an illness. Symptoms of depression also can be caused by conditions other than normal sadness and grief or clinical depression. Examples of these conditions are listed as follows:  Physical illness Some physical illnesses, including underactive thyroid gland (hypothyroidism), severe anemia, specific types of cancer, diabetes, uncontrolled seizures, heart and lung problems, strokes, and chronic pain are commonly associated with symptoms of depression.  Side effects of some prescription medicine In some people, certain types of prescription medicine can cause symptoms of depression.  Substance abuse Abuse of alcohol and illicit drugs can cause symptoms of depression. SYMPTOMS Symptoms of normal sadness and normal grief include the following:  Feeling sad or crying for short periods of time.  Not caring about anything (apathy).  Difficulty sleeping or sleeping too much.  No longer able to enjoy the things you used to enjoy.  Desire to be by oneself all the time (social isolation).  Lack of energy or motivation.  Difficulty  concentrating or remembering.  Change in appetite or weight.  Restlessness or agitation. Symptoms of clinical depression include the same symptoms of normal sadness or normal grief and also the following symptoms:  Feeling sad or crying all the time.  Feelings of guilt or worthlessness.  Feelings of hopelessness or helplessness.  Thoughts of suicide or the desire to harm yourself (suicidal ideation).  Loss of touch with reality (psychotic symptoms). Seeing or hearing things that are not real (hallucinations) or having false beliefs about your life or the people around you (delusions and paranoia). DIAGNOSIS  The diagnosis of clinical depression usually is based on the severity and duration of the symptoms. Your caregiver also will ask you questions about your medical history and substance use to find out if physical illness, use of prescription medicine, or substance abuse is causing your depression. Your caregiver also may order blood tests. TREATMENT  Typically, normal sadness and normal grief do not require treatment. However, sometimes antidepressant medicine is prescribed for bereavement to ease the depressive symptoms until they resolve. The treatment for clinical depression depends on the severity of your symptoms but typically includes antidepressant medicine, counseling with a mental health professional, or a combination of both. Your caregiver will help to determine what treatment is best for you. Depression caused by physical illness usually goes away with appropriate medical treatment of the illness. If prescription medicine is causing depression, talk with your caregiver about stopping the medicine, decreasing the dose, or substituting another medicine. Depression caused by abuse of alcohol or illicit drugs abuse goes away with abstinence from these substances. Some adults need professional help in order to stop drinking or using drugs. SEEK IMMEDIATE CARE IF:  You have   thoughts  about hurting yourself or others.  You lose touch with reality (have psychotic symptoms).  You are taking medicine for depression and have a serious side effect. FOR MORE INFORMATION National Alliance on Mental Illness: www.nami.[employer]: http://www.maynard.net/ Document Released: 01/21/2000 Document Revised: 07/25/2011 Document Reviewed: 04/24/2011 [employer] [facility] Patient Information 2014 [place], [place]. Panic Attacks Panic attacks are sudden, short-livedsurges of severe anxiety, fear, or discomfort. They may occur for no reason when you are relaxed, when you are anxious, or when you are sleeping. Panic attacks may occur for a number of reasons:   Healthy people occasionally have panic attacks in extreme, life-threatening situations, such as war or natural disasters. Normal anxiety is a protective mechanism of the body that helps [place] react to danger (fight or flight response).  Panic attacks are often seen with anxiety disorders, such as panic disorder, social anxiety disorder, generalized anxiety disorder, and phobias. Anxiety disorders cause excessive or uncontrollable anxiety. They may interfere with your relationships or other life activities.  Panic attacks are sometimes seen with other mental illnesses such as depression and posttraumatic stress disorder.  Certain medical conditions, prescription medicines, and drugs of abuse can cause panic attacks. SYMPTOMS  Panic attacks start suddenly, peak within 20 minutes, and are accompanied by four or more of the following symptoms:  Pounding heart or fast heart rate (palpitations).  Sweating.  Trembling or shaking.  Shortness of breath or feeling smothered.  Feeling choked.  Chest pain or discomfort.  Nausea or strange feeling in your stomach.  Dizziness, lightheadedness, or feeling like you will faint.  Chills or hot flushes.  Numbness or tingling in your lips or hands and feet.  Feeling that things are  not real or feeling that you are not yourself.  Fear of losing control or going crazy.  Fear of dying. Some of these symptoms can mimic serious medical conditions. For example, you may think you are having a heart attack. Although panic attacks can be very scary, they are not life threatening. DIAGNOSIS  Panic attacks are diagnosed through an assessment by your health care provider. Your health care provider will ask questions about your symptoms, such as where and when they occurred. Your health care provider will also ask about your medical history and use of alcohol and drugs, including prescription medicines. Your health care provider may order blood tests or other studies to rule out a serious medical condition. Your health care provider may refer you to a mental health professional for further evaluation. TREATMENT   Most healthy people who have one or two panic attacks in an extreme, life-threatening situation will not require treatment.  The treatment for panic attacks associated with anxiety disorders or other mental illness typically involves counseling with a mental health professional, medicine, or a combination of both. Your health care provider will help determine what treatment is best for you.  Panic attacks due to physical illness usually goes away with treatment of the illness. If prescription medicine is causing panic attacks, talk with your health care provider about stopping the medicine, decreasing the dose, or substituting another medicine.  Panic attacks due to alcohol or drug abuse goes away with abstinence. Some adults need professional help in order to stop drinking or using drugs. HOME CARE INSTRUCTIONS   Take all your medicines as prescribed.   Check with your health care provider before starting new prescription or over-the-counter medicines.  Keep all follow up appointments with your health care provider. SEEK MEDICAL CARE  IF:  You are not able to take your  medicines as prescribed.  Your symptoms do not improve or get worse. SEEK IMMEDIATE MEDICAL CARE IF:   You experience panic attack symptoms that are different than your usual symptoms.  You have serious thoughts about hurting yourself or others.  You are taking medicine for panic attacks and have a serious side effect. MAKE SURE YOU:  Understand these instructions.  Will watch your condition.  Will get help right away if you are not doing well or get worse. Document Released: 01/23/2005 Document Revised: 11/13/2012 Document Reviewed: 09/06/2012 [facility] Patient Information 2014 [place], [place]. Smoking Cessation Quitting smoking is important to your health and has many advantages. However, it is not always easy to quit since nicotine is a very addictive drug. Often times, people try 3 times or more before being able to quit. This document explains the best ways for you to prepare to quit smoking. Quitting takes hard work and a lot of effort, but you can do it. ADVANTAGES OF QUITTING SMOKING  You will live longer, feel better, and live better.  Your body will feel the impact of quitting smoking almost immediately.  Within 20 minutes, blood pressure decreases. Your pulse returns to its normal level.  After 8 hours, carbon monoxide levels in the blood return to normal. Your oxygen level increases.  After 24 hours, the chance of having a heart attack starts to decrease. Your breath, hair, and body stop smelling like smoke.  After 48 hours, damaged nerve endings begin to recover. Your sense of taste and smell improve.  After 72 hours, the body is virtually free of nicotine. Your bronchial tubes relax and breathing becomes easier.  After 2 to 12 weeks, lungs can hold more air. Exercise becomes easier and circulation improves.  The risk of having a heart attack, stroke, cancer, or lung disease is greatly reduced.  After 1 year, the risk of coronary heart disease is cut in  half.  After 5 years, the risk of stroke falls to the same as a nonsmoker.  After 10 years, the risk of lung cancer is cut in half and the risk of other cancers decreases significantly.  After 15 years, the risk of coronary heart disease drops, usually to the level of a nonsmoker.  If you are pregnant, quitting smoking will improve your chances of having a healthy baby.  The people you live with, especially any children, will be healthier.  You will have extra money to spend on things other than cigarettes. QUESTIONS TO THINK ABOUT BEFORE ATTEMPTING TO QUIT You may want to talk about your answers with your caregiver.  Why do you want to quit?  If you tried to quit in the past, what helped and what did not?  What will be the most difficult situations for you after you quit? How will you plan to handle them?  Who can help you through the tough times? Your family? Friends? A caregiver?  What pleasures do you get from smoking? What ways can you still get pleasure if you quit? Here are some questions to ask your caregiver:  How can you help me to be successful at quitting?  What medicine do you think would be best for me and how should I take it?  What should I do if I need more help?  What is smoking withdrawal like? How can I get information on withdrawal? GET READY  Set a quit date.  Change your environment by getting rid  of all cigarettes, ashtrays, matches, and lighters in your home, car, or work. Do not let people smoke in your home.  Review your past attempts to quit. Think about what worked and what did not. GET SUPPORT AND ENCOURAGEMENT You have a better chance of being successful if you have help. You can get support in many ways.  Tell your family, friends, and co-workers that you are going to quit and need their support. Ask them not to smoke around you.  Get individual, group, or telephone counseling and support. Programs are available at [employer] and  health centers. Call your local health department for information about programs in your area.  Spiritual beliefs and practices may help some smokers quit.  Download a "quit meter" on your computer to keep track of quit statistics, such as how long you have gone without smoking, cigarettes not smoked, and money saved.  Get a self-help book about quitting smoking and staying off of tobacco. LEARN NEW SKILLS AND BEHAVIORS  Distract yourself from urges to smoke. Talk to someone, go for a walk, or occupy your time with a task.  Change your normal routine. Take a different route to work. Drink tea instead of coffee. Eat breakfast in a different place.  Reduce your stress. Take a hot bath, exercise, or read a book.  Plan something enjoyable to do every day. Reward yourself for not smoking.  Explore interactive web-based programs that specialize in helping you quit. GET MEDICINE AND USE IT CORRECTLY Medicines can help you stop smoking and decrease the urge to smoke. Combining medicine with the above behavioral methods and support can greatly increase your chances of successfully quitting smoking.  Nicotine replacement therapy helps deliver nicotine to your body without the negative effects and risks of smoking. Nicotine replacement therapy includes nicotine gum, lozenges, inhalers, nasal sprays, and skin patches. Some may be available over-the-counter and others require a prescription.  Antidepressant medicine helps people abstain from smoking, but how this works is unknown. This medicine is available by prescription.  Nicotinic receptor partial agonist medicine simulates the effect of nicotine in your brain. This medicine is available by prescription. Ask your caregiver for advice about which medicines to use and how to use them based on your health history. Your caregiver will tell you what side effects to look out for if you choose to be on a medicine or therapy. Carefully read the information  on the package. Do not use any other product containing nicotine while using a nicotine replacement product.  RELAPSE OR DIFFICULT SITUATIONS Most relapses occur within the first 3 months after quitting. Do not be discouraged if you start smoking again. Remember, most people try several times before finally quitting. You may have symptoms of withdrawal because your body is used to nicotine. You may crave cigarettes, be irritable, feel very hungry, cough often, get headaches, or have difficulty concentrating. The withdrawal symptoms are only temporary. They are strongest when you first quit, but they will go away within 10 14 days. To reduce the chances of relapse, try to:  Avoid drinking alcohol. Drinking lowers your chances of successfully quitting.  Reduce the amount of caffeine you consume. Once you quit smoking, the amount of caffeine in your body increases and can give you symptoms, such as a rapid heartbeat, sweating, and anxiety.  Avoid smokers because they can make you want to smoke.  Do not let weight gain distract you. Many smokers will gain weight when they quit, usually less than 10  pounds. Eat a healthy diet and stay active. You can always lose the weight gained after you quit.  Find ways to improve your mood other than smoking. FOR MORE INFORMATION  www.smokefree.gov  Document Released: 01/17/2001 Document Revised: 07/25/2011 Document Reviewed: 05/04/2011 [facility] Patient Information 2014 [place].

## 2013-03-24 NOTE — Progress Notes (Signed)
   Subjective:    Patient ID: Tony Stanley, male    DOB: 09/30/1975, 38 y.o.   MRN: [id number]  HPI    Review of Systems     Objective:   Physical Exam        Assessment & Plan:

## 2013-03-24 NOTE — Progress Notes (Signed)
   Subjective:    Patient ID: Tony MediciStephen Stanley, male    DOB: 10/26/1975, 38 y.o.   MRN: [id number]  HPI [place] and states he has been without his medication x 4 months. He states his wife has left him and he is currently living in an extended stay hotel. He is also unemployed and has since lost his dog to an unexpected death. He states he was taking alprazolan, zoloft, adderall in which he is needing a refill on today. Tony Stanley states he hasn't been able to function well without his medications. He has a hx of panic attacks and depression. He states he had a panic attack this morning. He states his medication has worked well in the past but since not taking them over the past 4 months, he has panic attacks daily. He has no other complaints today.    Review of Systems     Objective:   Physical Exam  Vitals reviewed. Constitutional: He is oriented to person, place, and time. He appears well-developed and well-nourished. No distress.  HENT:  Head: Normocephalic.  Nose: Nose normal.  Eyes: EOM are normal. Pupils are equal, round, and reactive to light. No scleral icterus.  Neck: Normal range of motion.  Cardiovascular: Normal rate, regular rhythm and normal heart sounds.   Pulmonary/Chest: Effort normal. No respiratory distress. He has wheezes. He exhibits no tenderness.  Abdominal: Soft.  Musculoskeletal: Normal range of motion.  Neurological: He is alert and oriented to person, place, and time. He exhibits normal muscle tone. Coordination normal.  Skin: No rash noted.  Psychiatric: Judgment and thought content normal. His mood appears anxious. His speech is rapid and/or pressured. He is agitated and hyperactive. Cognition and memory are normal. He exhibits a depressed mood.  Refer to mental health   Hyper alert       Assessment & Plan:  Depressio/Panic attacks/ADHD Refer for ADHD/Names given Refill Sertraline 3 months/Alprazolam 1 month Quit  smoking info
